# Patient Record
Sex: Female | Born: 1975 | Race: White | Hispanic: No | Marital: Married | Smoking: Never smoker
Health system: Southern US, Community
[De-identification: ages and names within clinical notes are randomized; demographics above are authoritative.]

## PROBLEM LIST (undated history)

## (undated) DIAGNOSIS — D509 Iron deficiency anemia, unspecified: Secondary | ICD-10-CM

## (undated) DIAGNOSIS — E785 Hyperlipidemia, unspecified: Secondary | ICD-10-CM

## (undated) DIAGNOSIS — E119 Type 2 diabetes mellitus without complications: Secondary | ICD-10-CM

## (undated) DIAGNOSIS — R000 Tachycardia, unspecified: Secondary | ICD-10-CM

## (undated) DIAGNOSIS — I10 Essential (primary) hypertension: Secondary | ICD-10-CM

## (undated) HISTORY — DX: Iron deficiency anemia, unspecified: D50.9

## (undated) HISTORY — DX: Tachycardia, unspecified: R00.0

## (undated) HISTORY — DX: Essential (primary) hypertension: I10

## (undated) HISTORY — DX: Type 2 diabetes mellitus without complications: E11.9

## (undated) HISTORY — DX: Hyperlipidemia, unspecified: E78.5

---

## 2018-05-22 NOTE — Progress Notes (Signed)
Cardiology Office Note   Date:  05/25/2018   ID:  BRENNA STEERMAN, DOB 1975-12-17, MRN [id number]  PCP:  Aderoju, Marin Olp, MD  Cardiologist:   Charlton Haws, MD   No chief complaint on file.     History of Present Illness: Kristine Barker is a 43 y.o. female who presents for consultation regarding Tachycardia . Referred by Aderoju,Elizabeth She has a history of tachycardia and diabetes. Thyroid has been normal and not anemic. Started on Toprol in addition to ACE for BP and pulse. Mother had MI and tachycardia. A1c is poorly controlled at 8.0 TTE done 02/23/18 Normal mild LVH no valve disease ? Small PFO no evidence of pulmonary HTN   Holter showed average HR 96 with range 61-156 no significant arrhythmia   She is also worried about her risk of MI Family history positive No chest pain Started on statin a few weeks ago    Past Medical History:  Diagnosis Date  . Diabetes mellitus (HCC)   . Hyperlipidemia   . Hypertension   . Iron deficiency anemia   . Tachycardia      Current Outpatient Medications  Medication Sig Dispense Refill  . aspirin EC 81 MG tablet Take 81 mg by mouth daily.    . Calcium Carbonate-Vitamin D3 600-400 MG-UNIT TABS Take 2 tablets by mouth daily.    Marland Kitchen glipiZIDE (GLUCOTROL XL) 5 MG 24 hr tablet Take 5 mg by mouth daily with breakfast.    . lisinopril (PRINIVIL,ZESTRIL) 10 MG tablet Take 10 mg by mouth daily.    . metFORMIN (GLUCOPHAGE-XR) 500 MG 24 hr tablet Take 2,000 mg by mouth daily with breakfast. Take 4 tablets by mouth daily    . metoprolol succinate (TOPROL-XL) 50 MG 24 hr tablet Take 50 mg by mouth daily.     No current facility-administered medications for this visit.     Allergies:   Patient has no known allergies.    Social History:  The patient  reports that she has never smoked. She has never used smokeless tobacco. She reports that she does not drink alcohol or use drugs.   Family History:  The patient's family history  is not on file.    ROS:  Please see the history of present illness.   Otherwise, review of systems are positive for none.   All other systems are reviewed and negative.    PHYSICAL EXAM: VS:  BP 128/90   Pulse (!) 109   Ht 5\' 3"  (1.6 m)   Wt 170 lb (77.1 kg)   BMI 30.11 kg/m  , BMI Body mass index is 30.11 kg/m. Affect appropriate Healthy:  appears stated age HEENT: normal Neck supple with no adenopathy JVP normal no bruits no thyromegaly Lungs clear with no wheezing and good diaphragmatic motion Heart:  S1/S2 no murmur, no rub, gallop or click PMI normal Abdomen: benighn, BS positve, no tenderness, no AAA no bruit.  No HSM or HJR Distal pulses intact with no bruits No edema Neuro non-focal Skin warm and dry No muscular weakness    EKG:  SR rate 96 normal    Recent Labs: No results found for requested labs within last 8760 hours.    Lipid Panel No results found for: CHOL, TRIG, HDL, CHOLHDL, VLDL, LDLCALC, LDLDIRECT    Wt Readings from Last 3 Encounters:  05/25/18 170 lb (77.1 kg)      Other studies Reviewed: Additional studies/ records that were reviewed today include: notes from Dr  [facility] Card[facility] Notes primary labs ECG , TTE and Holter .    ASSESSMENT AND PLAN:  1.  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  2. CAD Risk recommended calcium score hopefully to be done today 3. HLD:  Started on statin f/u labs primary LDL goal will partially depend on calcium score 4. DM:  Discussed low carb diet.  Target hemoglobin A1c is 6.5 or less.  Continue current medications. 5. HTN:  Well controlled.  Continue current medications and low sodium Dash type diet.      Current medicines are reviewed at length with the patient today.  The patient does not have concerns regarding medicines.  The following changes have been made:   no change  Labs/ tests ordered today include: Calcium score No orders of the defined types were placed in this encounter.    Disposition:   FU with cardiology PRN      Signed, Charlton Haws, MD  05/25/2018 9:42 AM    [facility] [address] [place], [place], [place]  [address] Phone: [phone]; Fax: [phone]

## 2018-05-23 ENCOUNTER — Encounter: Payer: Self-pay | Admitting: Cardiovascular Disease

## 2018-05-25 ENCOUNTER — Encounter: Payer: Self-pay | Admitting: Cardiovascular Disease

## 2018-05-25 ENCOUNTER — Ambulatory Visit (INDEPENDENT_AMBULATORY_CARE_PROVIDER_SITE_OTHER)
Admission: RE | Admit: 2018-05-25 | Discharge: 2018-05-25 | Disposition: A | Discharge status: Home / Self Care | Payer: Managed Care, Other (non HMO) | Source: Ambulatory Visit | Attending: Cardiovascular Disease | Admitting: Cardiovascular Disease

## 2018-05-25 ENCOUNTER — Telehealth: Payer: Self-pay

## 2018-05-25 ENCOUNTER — Ambulatory Visit: Payer: Managed Care, Other (non HMO) | Admitting: Cardiovascular Disease

## 2018-05-25 ENCOUNTER — Encounter (INDEPENDENT_AMBULATORY_CARE_PROVIDER_SITE_OTHER): Payer: Self-pay

## 2018-05-25 VITALS — BP 128/90 | HR 109 | Ht 63.0 in | Wt 170.0 lb

## 2018-05-25 DIAGNOSIS — R000 Tachycardia, unspecified: Secondary | ICD-10-CM

## 2018-05-25 DIAGNOSIS — R931 Abnormal findings on diagnostic imaging of heart and coronary circulation: Secondary | ICD-10-CM

## 2018-05-25 NOTE — Telephone Encounter (Signed)
-----   Message from Wendall Stade, MD sent at 05/25/2018 11:38 AM EST ----- Calcium score high Just started on statin target LDL less than 70  Since score high have her do f/u ETT

## 2018-05-25 NOTE — Patient Instructions (Addendum)
Medication Instructions:   If you need a refill on your cardiac medications before your next appointment, please call your pharmacy.   Lab work:  If you have labs (blood work) drawn today and your tests are completely normal, you will receive your results only by: . MyChart Message (if you have MyChart) OR . A paper copy in the mail If you have any lab test that is abnormal or we need to change your treatment, we will call you to review the results.  Testing/Procedures  Cardiac CT scanning for calcium score, (CAT scanning), is a noninvasive, special x-ray that produces cross-sectional images of the body using x-rays and a computer. CT scans help physicians diagnose and treat medical conditions. For some CT exams, a contrast material is used to enhance visibility in the area of the body being studied. CT scans provide greater clarity and reveal more details than regular x-ray exams.  Follow-Up: At [facility], you and your health needs are our priority.  As part of our continuing mission to provide you with exceptional heart care, we have created designated Provider Care Teams.  These Care Teams include your primary Cardiologist (physician) and Advanced Practice Providers (APPs -  Physician Assistants and Nurse Practitioners) who all work together to provide you with the care you need, when you need it. Your physician recommends that you schedule a follow-up appointment as needed with Dr. Nishan.    

## 2018-05-25 NOTE — Telephone Encounter (Signed)
Called patient with results. Per Dr. Eden EmmsNishan, calcium score high Just started on statin target LDL less than 70. Since score high have her do f/u ETT. Will order ETT. Patient given instructions for ETT and told to hold her metoprolol for test. Patient verbalized understanding.  Will send message to scheduling to call patient with an appointment.

## 2018-05-28 NOTE — Addendum Note (Signed)
Addended by: Oleta Mouse on: 05/28/2018 07:58 AM   Modules accepted: Orders

## 2018-05-28 NOTE — Addendum Note (Signed)
Addended by: Oleta MouseVERTON, Monta Maiorana M on: 05/28/2018 12:48 PM   Modules accepted: Orders

## 2018-06-05 ENCOUNTER — Telehealth: Payer: Self-pay | Admitting: Cardiovascular Disease

## 2018-06-05 NOTE — Telephone Encounter (Signed)
Left message for patient to call back  

## 2018-06-05 NOTE — Telephone Encounter (Signed)
New Message   PT is calling because she is a new pt and was seen and has had a CT and will have her Treadmill test Friday. She is not sure if she is suppose to schedule a follow up appt after these tests  Please call and advise

## 2018-06-08 ENCOUNTER — Ambulatory Visit (INDEPENDENT_AMBULATORY_CARE_PROVIDER_SITE_OTHER): Payer: Managed Care, Other (non HMO)

## 2018-06-08 DIAGNOSIS — R931 Abnormal findings on diagnostic imaging of heart and coronary circulation: Secondary | ICD-10-CM | POA: Diagnosis not present

## 2018-06-08 LAB — EXERCISE TOLERANCE TEST
CSEPPHR: 184 {beats}/min
Estimated workload: 13.4 METS
Exercise duration (min): 10 min
Exercise duration (sec): 30 s
MPHR: 178 {beats}/min
Percent HR: 103 %
RPE: 15
Rest HR: 112 {beats}/min

## 2018-06-11 ENCOUNTER — Telehealth: Payer: Self-pay

## 2018-06-11 NOTE — Telephone Encounter (Signed)
-----   Message from Wendall Stade, MD sent at 06/11/2018  9:23 AM EST ----- Needs lipid and liver profile and started on statin if LDL over 70 can f/u with cardiology in a year

## 2018-06-11 NOTE — Telephone Encounter (Signed)
Called patient with recommendations. Patient stated she is already taking Atorvastatin 20 mg by mouth daily. Patient stated she started this in December and her PCP is checking her Lipid profile in March. Requested patient send our office a copy of lab work once it is done, since patient lives in [place]. Informed patient that she would get a letter in 10 months letting her know it is time to schedule. Patient verbalized understanding. Will update patient's medication list and put in recall.

## 2018-06-13 NOTE — Telephone Encounter (Signed)
Patient is aware that she will be seeing Dr. Eden Emms in one year for f/u

## 2019-02-01 IMAGING — CT CT HEART SCORING
2 series · 14 of 20 positions shown, 16 images · non-contrast
Comparison: None.

Addendum:
CLINICAL DATA: Risk stratification

EXAM:
Coronary Calcium Score
TECHNIQUE: The patient was scanned on a Siemens Somatom 64 slice scanner. Axial
non-contrast 3mm slices were carried out through the heart. The data
set was analyzed on a dedicated work station and scored using the
Agatson method.

[Series 3: casc 3.0 i36f 2 bestdiast 68 % · axial · 0.34mm/px · z∈[+1307,+1382]mm · 6 of 35 slices shown, 8 images]
[im 5/35  vessel]
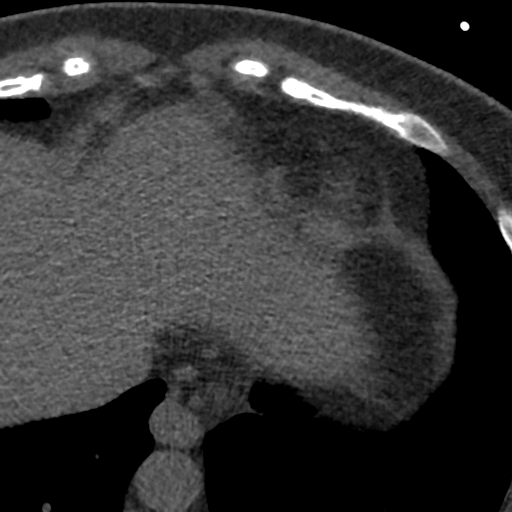
[im 5/35  lung]
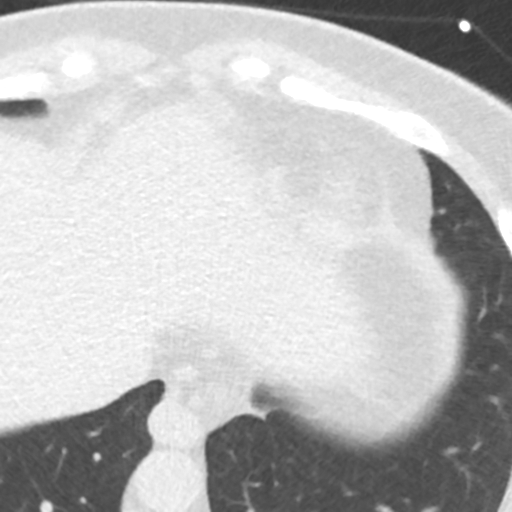
[im 10/35  vessel]
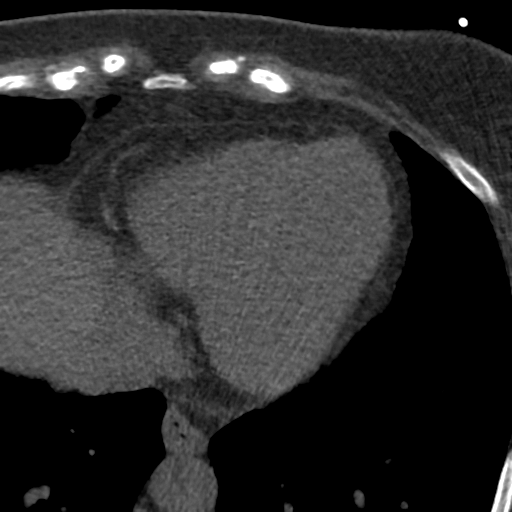
[im 15/35  vessel]
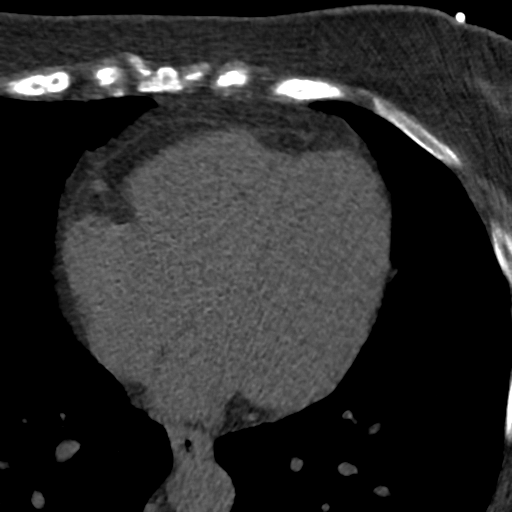
[im 20/35  vessel]
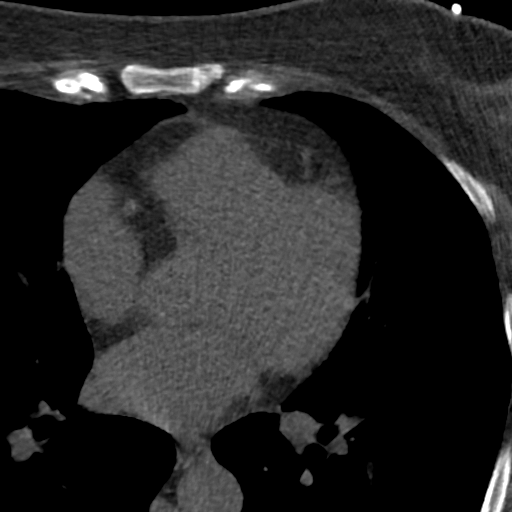
[im 25/35  vessel]
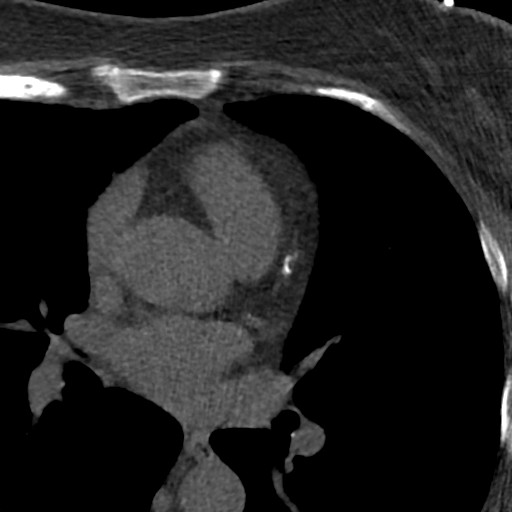
[im 25/35  lung]
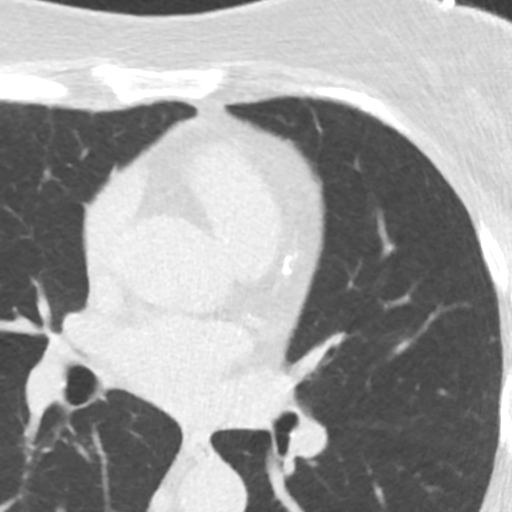
[im 30/35  vessel]
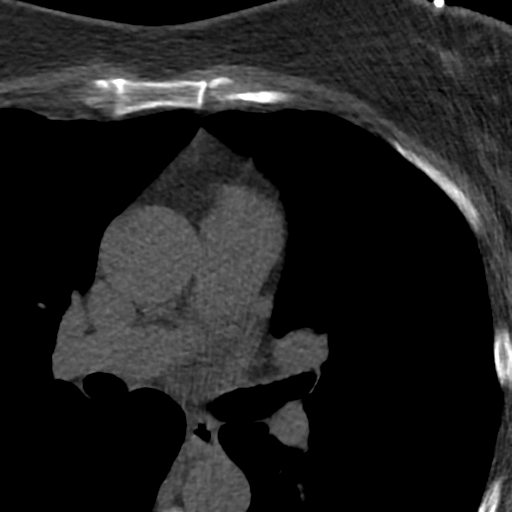

[Series 7: axial 73 % · axial · 0.49mm/px · z∈[+1304,+1386]mm · 8 of 51 slices shown]
[im 5/51  vessel]
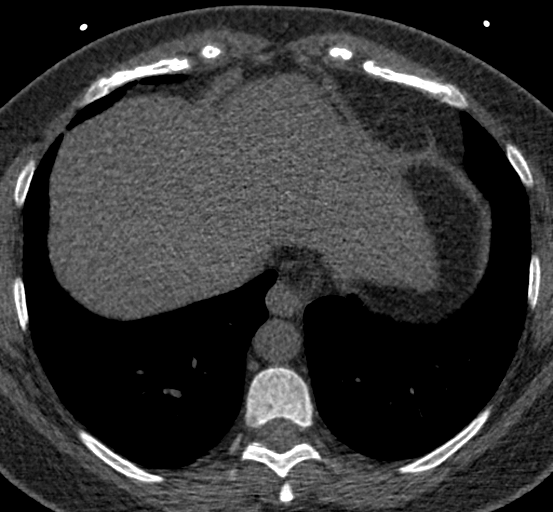
[im 10/51  vessel]
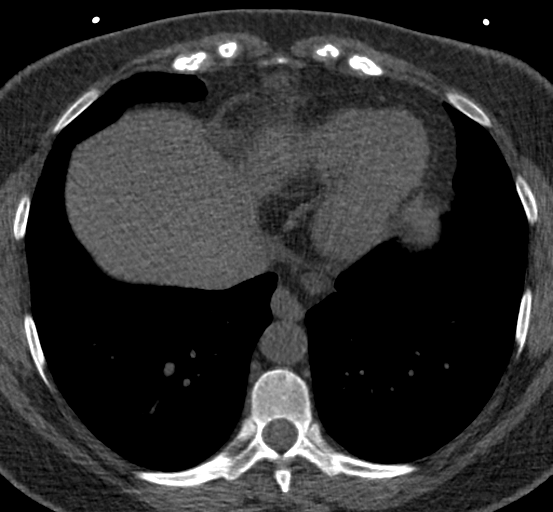
[im 19/51  vessel]
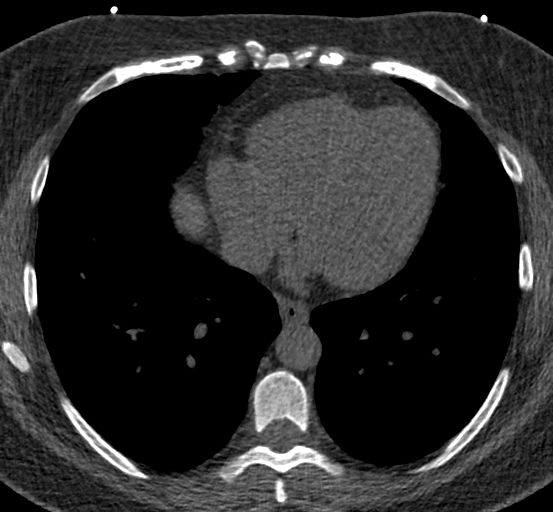
[im 23/51  vessel]
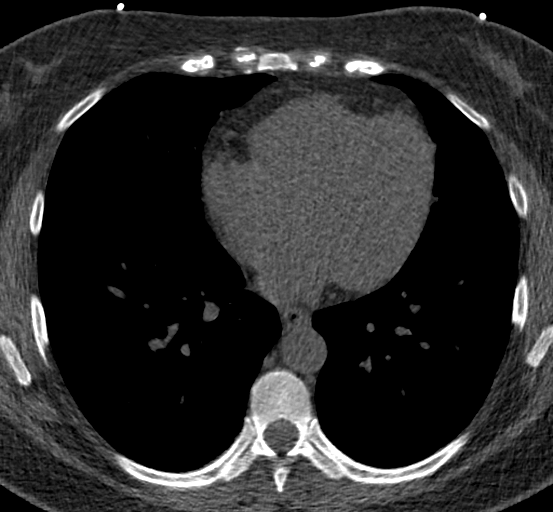
[im 28/51  vessel]
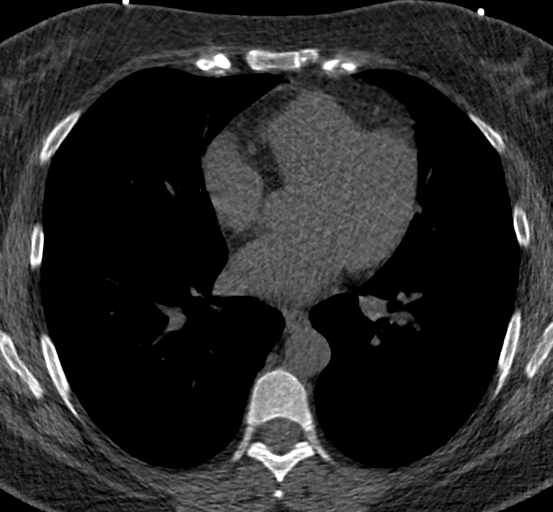
[im 32/51  vessel]
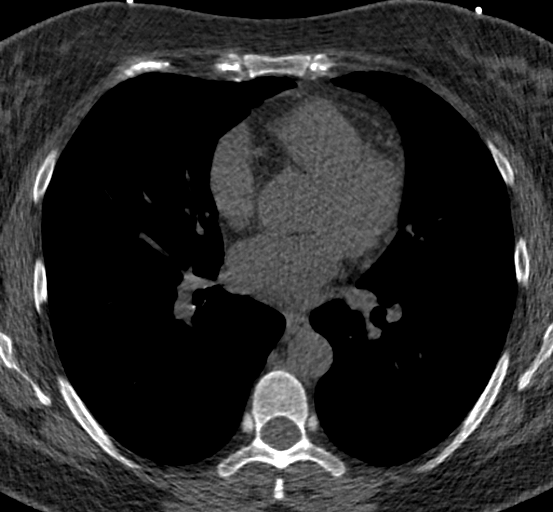
[im 41/51  vessel]
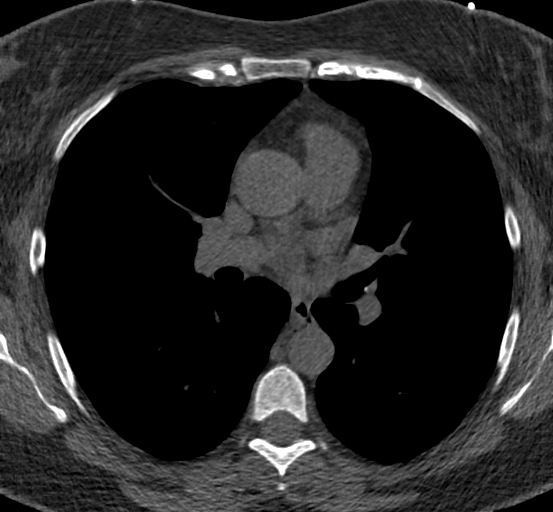
[im 46/51  vessel]
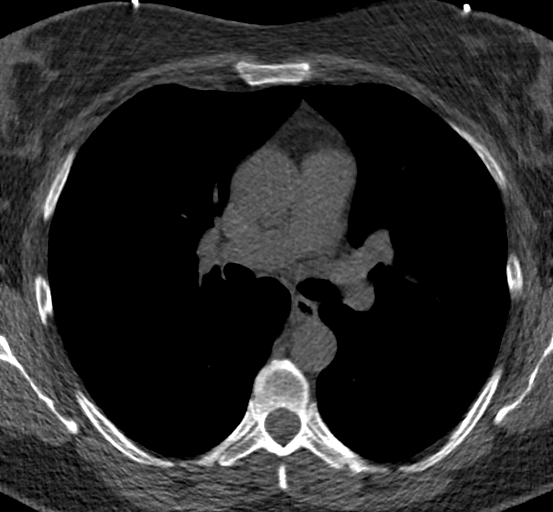

[14 of 20 positions shown; findings below may reference images not displayed]

FINDINGS: Non-cardiac: No significant non cardiac findings on limited lung and
soft tissue windows. See separate report from [REDACTED].

Ascending Aorta: Normal diameter 3.2 cm

Pericardium: Normal

Coronary arteries: Calcium noted in proximal and mid LAD as well as
proximal and distal RCA
IMPRESSION: Coronary calcium score of 74. This was 99 th percentile for age and
sex matched control.

Jaimini Lowery

EXAM:
OVER-READ INTERPRETATION  CT CHEST

The following report is an over-read performed by radiologist Dr.
over-read does not include interpretation of cardiac or coronary
anatomy or pathology. The coronary calcium score interpretation by
the cardiologist is attached.
FINDINGS: The visualized portions of the mediastinum are unremarkable. There
is no adenopathy or mass identified.

Fat density subpleural nodule overlying the posteromedial right
lower lobe is identified compatible with a benign abnormality. No
suspicious nodules or masses identified.

Visualized portions of the upper abdomen are unremarkable.

No acute or suspicious bone abnormality identified.
IMPRESSION: 1. Negative over-read.

*** End of Addendum ***

## 2019-07-15 NOTE — Progress Notes (Deleted)
Cardiology Office Note   Date:  07/15/2019   ID:  Kristine Barker, DOB 1975/07/17, MRN [id number]  PCP:  Aderoju, Jadene Pierini, MD  Cardiologist:   Jenkins Rouge, MD   No chief complaint on file.     History of Present Illness: Kristine Barker is a 44 y.o. female who presents for f/u regarding Tachycardia HLD and CAD . Referred by Aderoju,Elizabeth  On 05/25/18 She has a history of tachycardia and diabetes. Thyroid has been normal and not anemic. Started on Toprol in addition to ACE for BP and pulse. Mother had MI and tachycardia. A1c is poorly controlled at 8.0  TTE done 02/23/18 Normal mild LVH no valve disease ? Small PFO no evidence of pulmonary HTN   Holter showed average HR 96 with range 61-156 no significant arrhythmia   She is also worried about her risk of MI Family history positive No chest pain Started on statin 2020 ***  Calcium Score 05/25/18 74 which is 99 th percentile for age ETT normal on 06/08/18   ***   Past Medical History:  Diagnosis Date  . Diabetes mellitus ([place])   . Hyperlipidemia   . Hypertension   . Iron deficiency anemia   . Tachycardia      Current Outpatient Medications  Medication Sig Dispense Refill  . aspirin EC 81 MG tablet Take 81 mg by mouth daily.    Marland Kitchen atorvastatin (LIPITOR) 20 MG tablet Take 20 mg by mouth daily at 6 PM.    . Calcium Carbonate-Vitamin D3 600-400 MG-UNIT TABS Take 2 tablets by mouth daily.    Marland Kitchen glipiZIDE (GLUCOTROL XL) 5 MG 24 hr tablet Take 5 mg by mouth daily with breakfast.    . lisinopril (PRINIVIL,ZESTRIL) 10 MG tablet Take 10 mg by mouth daily.    . metFORMIN (GLUCOPHAGE-XR) 500 MG 24 hr tablet Take 2,000 mg by mouth daily with breakfast. Take 4 tablets by mouth daily    . metoprolol succinate (TOPROL-XL) 50 MG 24 hr tablet Take 50 mg by mouth daily.     No current facility-administered medications for this visit.    Allergies:   Patient has no known allergies.    Social History:  The patient   reports that she has never smoked. She has never used smokeless tobacco. She reports that she does not drink alcohol or use drugs.   Family History:  The patient's family history is not on file.    ROS:  Please see the history of present illness.   Otherwise, review of systems are positive for none.   All other systems are reviewed and negative.    PHYSICAL EXAM: VS:  There were no vitals taken for this visit. , BMI There is no height or weight on file to calculate BMI. Affect appropriate Healthy:  appears stated age 89: normal Neck supple with no adenopathy JVP normal no bruits no thyromegaly Lungs clear with no wheezing and good diaphragmatic motion Heart:  S1/S2 no murmur, no rub, gallop or click PMI normal Abdomen: benighn, BS positve, no tenderness, no AAA no bruit.  No HSM or HJR Distal pulses intact with no bruits No edema Neuro non-focal Skin warm and dry No muscular weakness    EKG:  SR rate 96 normal    Recent Labs: No results found for requested labs within last 8760 hours.    Lipid Panel No results found for: CHOL, TRIG, HDL, CHOLHDL, VLDL, LDLCALC, LDLDIRECT    Wt Readings from Last 3  Encounters:  05/25/18 170 lb (77.1 kg)      Other studies Reviewed: Additional studies/ records that were reviewed today include: notes from Dr Ramond Dial Cardiology Notes primary labs ECG , TTE and Holter .    ASSESSMENT AND PLAN:  1.  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  2. CAD Risk  Calcium score 74 99 th percentile for age 53/31/20 Normal ETT 06/08/18 on ASA and statin  3. HLD:  On statin *** 4. DM:  Discussed low carb diet.  Target hemoglobin A1c is 6.5 or less.  Continue current medications. 5. HTN:  Well controlled.  Continue current medications and low sodium Dash type diet.      Current medicines are reviewed at  length with the patient today.  The patient does not have concerns regarding medicines.  The following changes have been made:  no change  Labs/ tests ordered today include: *** No orders of the defined types were placed in this encounter.    Disposition:   FU with cardiology in a year     Signed, Charlton Haws, MD  07/15/2019 10:06 AM    [facility] [address] [place], [place], [place]  [address] Phone: [phone]; Fax: [phone]

## 2019-07-19 ENCOUNTER — Ambulatory Visit: Payer: Managed Care, Other (non HMO) | Admitting: Cardiovascular Disease

## 2019-09-24 NOTE — Progress Notes (Signed)
Cardiology Office Note   Date:  09/27/2019   ID:  Kristine Barker, DOB Dec 09, 1975, MRN [id number]  PCP:  Aderoju, Marin Olp, MD  Cardiologist:   Charlton Haws, MD   No chief complaint on file.     History of Present Illness: Kristine Barker is a 44 y.o. female first seen 05/25/18  regarding Tachycardia . Referred by Aderoju,Elizabeth She has a history of tachycardia and diabetes. Thyroid has been normal and not anemic. Started on Toprol in addition to ACE for BP and pulse. Mother had MI and tachycardia. A1c is poorly controlled at 8.0  TTE done 02/23/18 Normal mild LVH no valve disease ? Small PFO no evidence of pulmonary HTN   Holter showed average HR 96 with range 61-156 no significant arrhythmia   She is also worried about her risk of MI Family history positive No chest pain 2020 primary following labs  Normal ETT 06/08/18 Calcium score 74 05/25/18 which was 99 th percentile for age involving the proximal / mid LAD and proximal / distal RCA Started on statin   Her BS has been poorly controlled despite weight loss. 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 Diabetes mellitus (HCC)   . Hyperlipidemia   . Hypertension   . Iron deficiency anemia   . Tachycardia      Current Outpatient Medications  Medication Sig Dispense Refill  . aspirin EC 81 MG tablet Take 81 mg by mouth daily.    Marland Kitchen atorvastatin (LIPITOR) 20 MG tablet Take 20 mg by mouth daily at 6 PM.    . Calcium Carbonate-Vitamin D3 600-400 MG-UNIT TABS Take 2 tablets by mouth daily.    . Insulin Glargine (BASAGLAR KWIKPEN) 100 UNIT/ML Inject 24 Units into the skin at bedtime.    Marland Kitchen lisinopril (PRINIVIL,ZESTRIL) 10 MG tablet Take 10 mg by mouth daily.    .  metFORMIN (GLUCOPHAGE-XR) 500 MG 24 hr tablet Take 2,000 mg by mouth daily with breakfast. Take 4 tablets by mouth daily    . metoprolol succinate (TOPROL-XL) 50 MG 24 hr tablet Take 50 mg by mouth daily.    . TRULICITY 0.75 MG/0.5ML SOPN Inject 0.75 mLs into the skin once a week.     No current facility-administered medications for this visit.    Allergies:   Patient has no known allergies.    Social History:  The patient  reports that she has never smoked. She has never used smokeless tobacco. She reports that she does not drink alcohol or use drugs.   Family History:  The patient's family history is not on file.    ROS:  Please see the history of present illness.   Otherwise, review of systems are positive for none.   All other systems are reviewed and negative.    PHYSICAL EXAM: VS:  BP 116/74   Pulse 96   Ht 5\' 3"  (1.6 m)   Wt 160 lb (72.6 kg)   BMI 28.34 kg/m  , BMI Body mass index is 28.34 kg/m. Affect appropriate Healthy:  appears stated age HEENT: normal Neck supple with no adenopathy JVP normal no bruits no thyromegaly Lungs clear with no wheezing and good diaphragmatic motion Heart:  S1/S2 no murmur, no  rub, gallop or click PMI normal Abdomen: benighn, BS positve, no tenderness, no AAA no bruit.  No HSM or HJR Distal pulses intact with no bruits No edema Neuro non-focal Skin warm and dry No muscular weakness    EKG: 09/27/19  SR rate 96 normal    Recent Labs: No results found for requested labs within last 8760 hours.    Lipid Panel No results found for: CHOL, TRIG, HDL, CHOLHDL, VLDL, LDLCALC, LDLDIRECT    Wt Readings from Last 3 Encounters:  09/27/19 160 lb (72.6 kg)  05/25/18 170 lb (77.1 kg)      Other studies Reviewed: Additional studies/ records that were reviewed today include: notes from Dr Brock Bad Cardiology Notes primary labs ECG , TTE and Holter .    ASSESSMENT AND PLAN:  1.  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 CAD Risk high calcium score for age on statin ETT normal  3. HLD:  Started on statin f/u labs primary LDL goal 70 or less 4. DM:  Discussed low carb diet.  Target hemoglobin A1c is 6.5 or less.  Continue current medications. 5. HTN:  Well controlled.  Continue current medications and low sodium Dash type diet.    F/U in a year consider ETT every 3 years or with new symptoms  Current medicines are reviewed at length with the patient today.  The patient does not have concerns regarding medicines.  The following changes have been made:  no change  Labs/ tests ordered today include: None  No orders of the defined types were placed in this encounter.    Disposition:   FU with cardiology in a year      Signed, Jenkins Rouge, MD  09/27/2019 8:26 AM    [address], [place], [place]  [address] Phone: [phone]; Fax: [phone]

## 2019-09-27 ENCOUNTER — Other Ambulatory Visit: Payer: Self-pay

## 2019-09-27 ENCOUNTER — Ambulatory Visit: Payer: Managed Care, Other (non HMO) | Admitting: Cardiovascular Disease

## 2019-09-27 VITALS — BP 116/74 | HR 96 | Ht 63.0 in | Wt 160.0 lb

## 2019-09-27 DIAGNOSIS — R000 Tachycardia, unspecified: Secondary | ICD-10-CM

## 2019-09-27 NOTE — Patient Instructions (Addendum)

## 2019-09-27 NOTE — Addendum Note (Signed)
Addended by: Oleta Mouse on: 09/27/2019 09:12 AM   Modules accepted: Orders

## 2020-09-08 ENCOUNTER — Telehealth: Payer: Self-pay | Admitting: Cardiovascular Disease

## 2020-09-08 NOTE — Telephone Encounter (Signed)
New Message:      Pt wants to know if she needs another CT Calcium Scoring and Echo before her next iffice viisit?

## 2020-09-08 NOTE — Telephone Encounter (Signed)
The patient had calcium score Jan 2020. GXT Feb 2020  She saw Dr. Eden Emms June 2021 and "F/U in a year consider ETT every 3 years or with new symptoms" was recommended.   The patient reports she feels great and has no symptoms at this time. She has lost a lot of weight and her A1C has "drastically reduced."  Since Dr. Eden Emms only has virtual appointments available at this time, she is happy to bypass this year's visit and have testing done prior to her visit next year.   Will request Dr. Fabio Bering nurse to call next year to arrange stress test and office visit.   Will send to Dr. Eden Emms to see if he'd like any other testing prior to visit.

## 2021-09-16 NOTE — Progress Notes (Incomplete)
Cardiology Office Note   Date:  09/16/2021   ID:  Kristine Barker, DOB 1975/07/18, MRN [id number]  PCP:  Aderoju, Marin Olp, MD  Cardiologist:   Charlton Haws, MD   No chief complaint on file.     History of Present Illness: Kristine Barker is a 46 y.o. female first seen 05/25/18  regarding Tachycardia . Referred by Aderoju,Elizabeth She has a history of tachycardia and diabetes. Thyroid has been normal and not anemic. Started on Toprol in addition to ACE for BP and pulse. Mother had MI and tachycardia. A1c is poorly controlled at 8.0  TTE done 02/23/18 Normal mild LVH no valve disease ? 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     atorvastatin (LIPITOR) 20 MG tablet Take 20 mg by mouth daily at 6 PM.     Calcium Carbonate-Vitamin D3 600-400 MG-UNIT TABS Take 2 tablets by mouth daily.     Insulin Glargine (BASAGLAR KWIKPEN) 100 UNIT/ML Inject 24 Units into the skin at bedtime.     lisinopril (PRINIVIL,ZESTRIL) 10 MG tablet Take 10 mg by mouth daily.     metFORMIN (GLUCOPHAGE-XR) 500 MG 24 hr tablet Take 2,000 mg by mouth daily with breakfast. Take 4 tablets by mouth daily     metoprolol succinate (TOPROL-XL) 50 MG 24 hr tablet Take 50 mg  by mouth daily.     TRULICITY 0.75 MG/0.5ML SOPN Inject 0.75 mLs into the skin once a week.     No current facility-administered medications for this visit.    Allergies:   Patient has no known allergies.    Social History:  The patient  reports that she has never smoked. She has never used smokeless tobacco. She reports that she does not drink alcohol and does not use drugs.   Family History:  The patient's family history is not on file.    ROS:  Please see the history of present illness.   Otherwise, review of systems are positive for none.   All other systems are reviewed and negative.    PHYSICAL EXAM: VS:  There were no vitals taken for this visit. , BMI There is no height or weight on file to calculate BMI. Affect appropriate Healthy:  appears stated age HEENT: normal Neck supple with no adenopathy JVP normal no bruits no thyromegaly Lungs clear with no wheezing and good diaphragmatic motion Heart:  S1/S2 no murmur, no rub, gallop or click PMI normal Abdomen: benighn, BS positve, no tenderness, no AAA no bruit.  No HSM or HJR Distal pulses intact with no bruits No edema Neuro non-focal Skin warm and dry No muscular weakness  EKG: 09/27/19  SR rate 96 normal    Recent Labs: No results found for requested labs within last 8760 hours.    Lipid Panel No results found for: CHOL, TRIG, HDL, CHOLHDL, VLDL, LDLCALC, LDLDIRECT    Wt Readings from Last 3 Encounters:  09/27/19 160 lb (72.6 kg)  05/25/18 170 lb (77.1 kg)      Other studies Reviewed: Additional studies/ records that were reviewed today include: notes from Dr Ramond Dial Cardiology Notes primary labs ECG , TTE and Holter .    ASSESSMENT AND PLAN:  1.  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 CAD Risk high calcium score for age 31/31/20  on statin ETT  normal 06/08/18 will update calcium score *** 3. HLD:  on statin LDL 48 08/13/21 at goal  4. DM:  Discussed low carb diet.  Target hemoglobin A1c is 6.5 or less.  Continue current medications. Most recent A1c in Epic 6.0 07/24/20  5. HTN:  Well controlled.  Continue current medications and low sodium Dash type diet.    F/U in a year   Update Calcium Score ***  Current medicines are reviewed at length with the patient today.  The patient does not have concerns regarding medicines.  The following changes have been made:  no change  Labs/ tests ordered today include: None  No orders of the defined types were placed in this encounter.    Disposition:   FU with cardiology in a year      Signed, Charlton Haws, MD  09/16/2021 4:00 PM    [facility] [address] [place], [place], [place]  [address] Phone: [phone]; Fax: [phone]

## 2021-09-22 NOTE — Progress Notes (Unsigned)
Cardiology Office Note   Date:  09/24/2021   ID:  RANETTA ARMACOST, DOB August 18, 1975, MRN [id number]  PCP:  Aderoju, Marin Olp, MD  Cardiologist:   Charlton Haws, MD   No chief complaint on file.     History of Present Illness: Kristine Barker is a 46 y.o. female first seen 05/25/18  regarding Tachycardia . Referred by Aderoju,Elizabeth She has a history of tachycardia and diabetes. Thyroid has been normal and not anemic. Started on Toprol in addition to ACE for BP and pulse. Mother had MI and tachycardia. A1c is poorly controlled at 8.0  TTE done 02/23/18 Normal mild LVH no valve disease ? 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     atorvastatin (LIPITOR) 20 MG tablet Take 20 mg by mouth daily at 6 PM.     Calcium Carbonate-Vitamin D3 600-400 MG-UNIT TABS Take 2 tablets by mouth daily.     lisinopril (PRINIVIL,ZESTRIL) 10 MG tablet Take 10 mg by mouth daily.     metFORMIN (GLUCOPHAGE-XR) 500 MG 24 hr tablet Take 2,000 mg by mouth daily with breakfast. Take 4 tablets by mouth daily     tirzepatide (MOUNJARO) 7.5 MG/0.5ML Pen Inject into the skin. Inject 0.5 mLs (7.5 mg total) subcutaneously every 7 (seven) days      Insulin Glargine (BASAGLAR KWIKPEN) 100 UNIT/ML Inject 24 Units into the skin at bedtime. (Patient not taking: Reported on 09/24/2021)     metoprolol succinate (TOPROL-XL) 50 MG 24 hr tablet Take 50 mg by mouth daily.     TRULICITY 0.75 MG/0.5ML SOPN Inject 0.75 mLs into the skin once a week. (Patient not taking: Reported on 09/24/2021)     No current facility-administered medications for this visit.    Allergies:   Patient has no known allergies.    Social History:  The patient  reports that she has never smoked. She has never used smokeless tobacco. She reports that she does not drink alcohol and does not use drugs.   Family History:  The patient's family history is not on file.    ROS:  Please see the history of present illness.   Otherwise, review of systems are positive for none.   All other systems are reviewed and negative.    PHYSICAL EXAM: VS:  BP 110/60   Pulse 95   Ht 5\' 3"  (1.6 m)   Wt 121 lb (54.9 kg)   SpO2 98%   BMI 21.43 kg/m  , BMI Body mass index is 21.43 kg/m. Affect appropriate Healthy:  appears stated age HEENT: normal Neck supple with no adenopathy JVP  normal no bruits no thyromegaly Lungs clear with no wheezing and good diaphragmatic motion Heart:  S1/S2 no murmur, no rub, gallop or click PMI normal Abdomen: benighn, BS positve, no tenderness, no AAA no bruit.  No HSM or HJR Distal pulses intact with no bruits No edema Neuro non-focal Skin warm and dry No muscular weakness    EKG: 09/27/19  SR rate 96 normal 09/24/2021 NSR rate 95 normal    Recent Labs: No results found for requested labs within last 8760 hours.    Lipid Panel No results found for: CHOL, TRIG, HDL, CHOLHDL, VLDL, LDLCALC, LDLDIRECT    Wt Readings from Last 3 Encounters:  09/24/21 121 lb (54.9 kg)  09/27/19 160 lb (72.6 kg)  05/25/18 170 lb (77.1 kg)      Other studies Reviewed: Additional studies/ records that were reviewed today include: notes from Dr  Ramond Dial Cardiology Notes primary labs ECG , TTE and Holter .    ASSESSMENT AND PLAN:  1.  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 CAD Risk high calcium score for age 38/31/20  on statin ETT normal 06/08/18 will update calcium score  3. HLD:  on statin LDL 48 08/13/21 at goal  4. DM:  Discussed low carb diet.  Target hemoglobin A1c is 6.5 or less.  Continue current medications. Most recent A1c in Epic 6.0 07/24/20  5. HTN:  Well controlled.  Continue current medications and low sodium Dash type diet.    F/U in a year   Update Calcium Score   Current medicines are reviewed at length with the patient today.  The patient does not have concerns regarding medicines.  The following changes have been made:  no change  Labs/ tests ordered today include: None  No orders of the defined types were placed in this encounter.    Disposition:   FU with cardiology in a year      Signed, Charlton Haws, MD  09/24/2021 9:46 AM    [facility] [address] [place], [place], [place]  [address] Phone: [phone]; Fax: [phone]

## 2021-09-23 ENCOUNTER — Ambulatory Visit: Payer: Managed Care, Other (non HMO) | Admitting: Cardiovascular Disease

## 2021-09-24 ENCOUNTER — Ambulatory Visit: Payer: Managed Care, Other (non HMO) | Admitting: Cardiovascular Disease

## 2021-09-24 ENCOUNTER — Encounter: Payer: Self-pay | Admitting: Cardiovascular Disease

## 2021-09-24 VITALS — BP 110/60 | HR 95 | Ht 63.0 in | Wt 121.0 lb

## 2021-09-24 DIAGNOSIS — E785 Hyperlipidemia, unspecified: Secondary | ICD-10-CM | POA: Diagnosis not present

## 2021-09-24 DIAGNOSIS — R000 Tachycardia, unspecified: Secondary | ICD-10-CM

## 2021-09-24 NOTE — Patient Instructions (Signed)
Medication Instructions:  Your physician recommends that you continue on your current medications as directed. Please refer to the Current Medication list given to you today.  *If you need a refill on your cardiac medications before your next appointment, please call your pharmacy*  Lab Work: If you have labs (blood work) drawn today and your tests are completely normal, you will receive your results only by: MyChart Message (if you have MyChart) OR A paper copy in the mail If you have any lab test that is abnormal or we need to change your treatment, we will call you to review the results.  Testing/Procedures: Cardiac CT scanning for calcium score, (CAT scanning), is a noninvasive, special x-ray that produces cross-sectional images of the body using x-rays and a computer. CT scans help physicians diagnose and treat medical conditions. For some CT exams, a contrast material is used to enhance visibility in the area of the body being studied. CT scans provide greater clarity and reveal more details than regular x-ray exams.  Follow-Up: At [facility], you and your health needs are our priority.  As part of our continuing mission to provide you with exceptional heart care, we have created designated Provider Care Teams.  These Care Teams include your primary Cardiologist (physician) and Advanced Practice Providers (APPs -  Physician Assistants and Nurse Practitioners) who all work together to provide you with the care you need, when you need it.  We recommend signing up for the patient portal called "MyChart".  Sign up information is provided on this After Visit Summary.  MyChart is used to connect with patients for Virtual Visits (Telemedicine).  Patients are able to view lab/test results, encounter notes, upcoming appointments, etc.  Non-urgent messages can be sent to your provider as well.   To learn more about what you can do with MyChart, go to [email or web address].    Your next  appointment:   1 year(s)  The format for your next appointment:   In Person  Provider:   Peter Nishan, MD {  Important Information About Sugar       

## 2021-10-01 ENCOUNTER — Ambulatory Visit
Admission: RE | Admit: 2021-10-01 | Discharge: 2021-10-01 | Disposition: A | Discharge status: Home / Self Care | Payer: Managed Care, Other (non HMO) | Source: Ambulatory Visit | Attending: Cardiovascular Disease | Admitting: Cardiovascular Disease

## 2021-10-01 ENCOUNTER — Telehealth: Payer: Self-pay

## 2021-10-01 DIAGNOSIS — E785 Hyperlipidemia, unspecified: Secondary | ICD-10-CM

## 2021-10-01 DIAGNOSIS — R5383 Other fatigue: Secondary | ICD-10-CM

## 2021-10-01 DIAGNOSIS — R931 Abnormal findings on diagnostic imaging of heart and coronary circulation: Secondary | ICD-10-CM

## 2021-10-01 DIAGNOSIS — R000 Tachycardia, unspecified: Secondary | ICD-10-CM | POA: Insufficient documentation

## 2021-10-01 DIAGNOSIS — R0789 Other chest pain: Secondary | ICD-10-CM

## 2021-10-01 DIAGNOSIS — R002 Palpitations: Secondary | ICD-10-CM

## 2021-10-01 NOTE — Telephone Encounter (Signed)
-----   Message from Wendall Stade, MD sent at 10/01/2021 11:08 AM EDT ----- Calcium score much higher than 2020 needs f/u PET/CT scan

## 2021-10-01 NOTE — Telephone Encounter (Signed)
Calling back to get more information on the PET/CT Stress test needing to be done. She wants to know if she should be scheduling this or will someone else

## 2021-10-01 NOTE — Telephone Encounter (Signed)
The patient has been notified of the result and verbalized understanding.  All questions (if any) were answered. Ethelda Chick, RN 10/01/2021 11:30 AM    Place order for test and will send instructions through mychart.

## 2021-10-01 NOTE — Telephone Encounter (Signed)
Called patient back about message. Informed patient that someone will be calling her to schedule test from [facility].

## 2021-10-11 ENCOUNTER — Encounter: Payer: Self-pay | Admitting: Cardiovascular Disease

## 2021-10-20 NOTE — Telephone Encounter (Signed)
Called patient about her message. Patient complaining of feeling extremely tired, chest discomfort, and racing heart at times. Patient stated there is no way she could do a treadmill stress test. Will update patient's symptoms to order of the Cardiac PET/CT. Will forward to Dr. Eden Emms for further recommendations.

## 2022-02-15 ENCOUNTER — Telehealth: Payer: Self-pay

## 2022-02-15 DIAGNOSIS — E785 Hyperlipidemia, unspecified: Secondary | ICD-10-CM

## 2022-02-15 DIAGNOSIS — R931 Abnormal findings on diagnostic imaging of heart and coronary circulation: Secondary | ICD-10-CM

## 2022-02-15 DIAGNOSIS — R5383 Other fatigue: Secondary | ICD-10-CM

## 2022-02-15 DIAGNOSIS — R0789 Other chest pain: Secondary | ICD-10-CM

## 2022-02-15 DIAGNOSIS — R002 Palpitations: Secondary | ICD-10-CM

## 2022-02-15 NOTE — Telephone Encounter (Signed)
Josue Hector, MD  Michaelyn Barter, RN Can order exercise myovue    ----- Message -----  From: Michaelyn Barter, RN  Sent: 02/15/2022   1:21 PM EDT  To: Josue Hector, MD  Subject: FW: [facility] Pet Ct                                   Wound you like to order something else?  ----- Message -----  From: [name]  Sent: 02/15/2022   9:55 AM EDT  To: Michaelyn Barter, RN  Subject: [facility] Pet Ct                                       Spoke with [facility] - patient test was denied  per Insurance .  Order will be close    Ordered Kristine Barker for patient. Patient aware of change. Will send instructions through mychart.

## 2022-05-11 ENCOUNTER — Encounter: Payer: Self-pay | Admitting: Cardiovascular Disease

## 2022-05-18 ENCOUNTER — Telehealth (HOSPITAL_COMMUNITY): Payer: Self-pay | Admitting: *Deleted

## 2022-05-18 NOTE — Telephone Encounter (Signed)
Patient given detailed instructions per Myocardial Perfusion Study Information Sheet for the test on 05/20/2022 at 7:45. Patient notified to arrive 15 minutes early and that it is imperative to arrive on time for appointment to keep from having the test rescheduled.  If you need to cancel or reschedule your appointment, please call the office within 24 hours of your appointment. . Patient verbalized understanding.Kristine Barker

## 2022-05-20 ENCOUNTER — Ambulatory Visit (HOSPITAL_COMMUNITY): Payer: Managed Care, Other (non HMO) | Attending: Internal Medicine

## 2022-05-20 DIAGNOSIS — R5383 Other fatigue: Secondary | ICD-10-CM | POA: Insufficient documentation

## 2022-05-20 DIAGNOSIS — R931 Abnormal findings on diagnostic imaging of heart and coronary circulation: Secondary | ICD-10-CM | POA: Insufficient documentation

## 2022-05-20 DIAGNOSIS — R002 Palpitations: Secondary | ICD-10-CM | POA: Insufficient documentation

## 2022-05-20 DIAGNOSIS — E785 Hyperlipidemia, unspecified: Secondary | ICD-10-CM | POA: Diagnosis present

## 2022-05-20 DIAGNOSIS — R0789 Other chest pain: Secondary | ICD-10-CM | POA: Insufficient documentation

## 2022-05-20 LAB — MYOCARDIAL PERFUSION IMAGING
Angina Index: 0
Duke Treadmill Score: 10
Estimated workload: 11.7
Exercise duration (min): 10 min
Exercise duration (sec): 1 s
LV dias vol: 41 mL (ref 46–106)
LV sys vol: 12 mL
MPHR: 174 {beats}/min
Nuc Stress EF: 72 %
Peak HR: 184 {beats}/min
Percent HR: 105 %
Rest HR: 104 {beats}/min
Rest Nuclear Isotope Dose: 10.9 mCi
SDS: 1
SRS: 1
SSS: 2
ST Depression (mm): 0 mm
Stress Nuclear Isotope Dose: 32.1 mCi
TID: 0.86

## 2022-05-20 MED ORDER — TECHNETIUM TC 99M TETROFOSMIN IV KIT
32.1000 | PACK | Freq: Once | INTRAVENOUS | Status: AC | PRN
  Administered 2022-05-20: 32.1 via INTRAVENOUS

## 2022-05-20 MED ORDER — TECHNETIUM TC 99M TETROFOSMIN IV KIT
10.9000 | PACK | Freq: Once | INTRAVENOUS | Status: AC | PRN
  Administered 2022-05-20: 10.9 via INTRAVENOUS

## 2022-06-10 IMAGING — CT CT CARDIAC CORONARY ARTERY CALCIUM SCORE
4 series · 12 of 20 positions shown, 13 images · non-contrast
Comparison: CT coronary calcium score 05/25/2018

Addendum:
CLINICAL DATA: Risk stratification

EXAM:
Coronary Calcium Score
TECHNIQUE: The patient was scanned on a Siemens Somatom 64 slice scanner. Axial
non-contrast 3mm slices were carried out through the heart. The data
set was analyzed on a dedicated work station and scored using the
Agatson method.

[Series 2: ax ca scr 70% (id) · axial · 0.27mm/px · z∈[+1382,+1460]mm · 4 of 66 slices shown (1 of 2)]
[im 14/66  vessel]
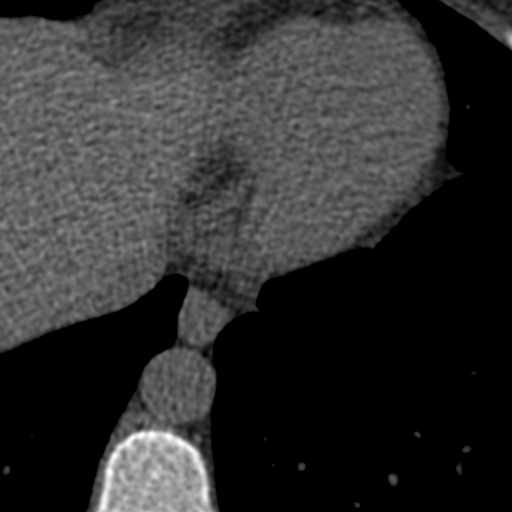
[im 27/66  vessel]
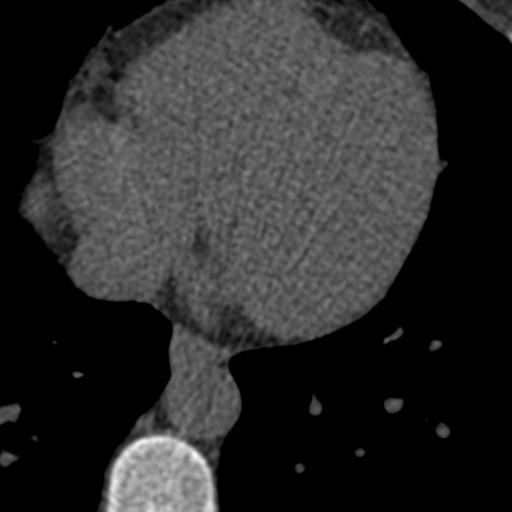
[im 40/66  vessel]
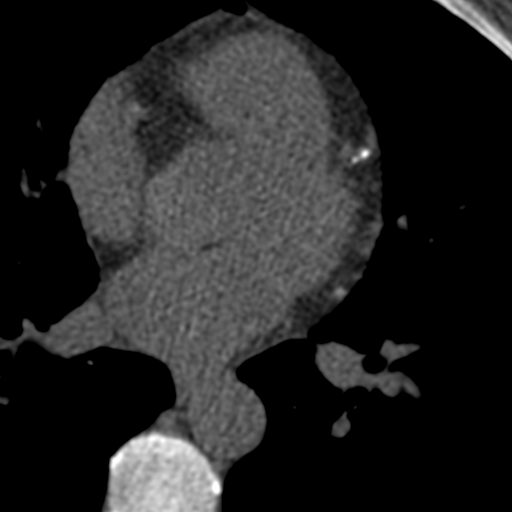
[im 53/66  vessel]
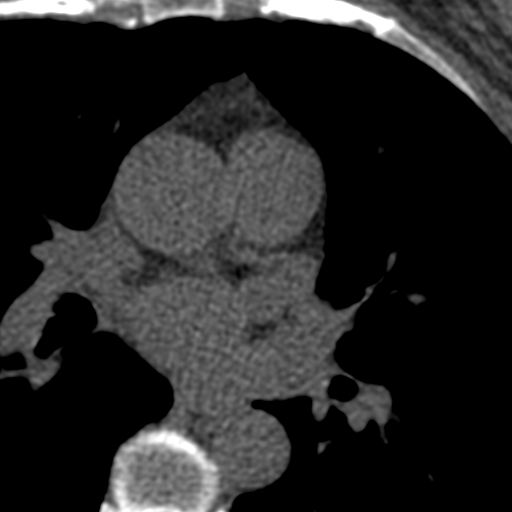

[Series 3: ax st (open fov) · axial · 0.55mm/px · z∈[+1399,+1441]mm · 2 of 44 slices shown, 3 images]
[im 15/44  vessel]
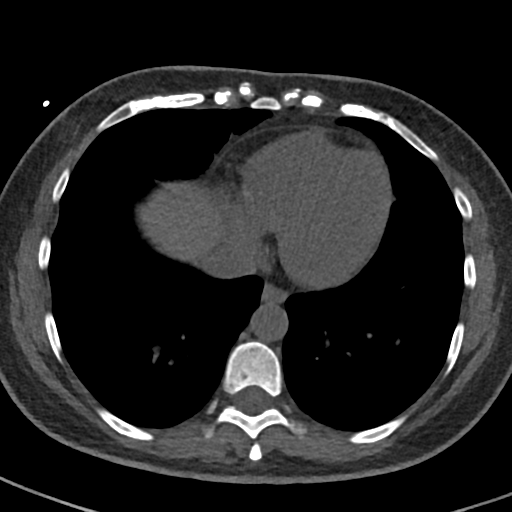
[im 15/44  lung]
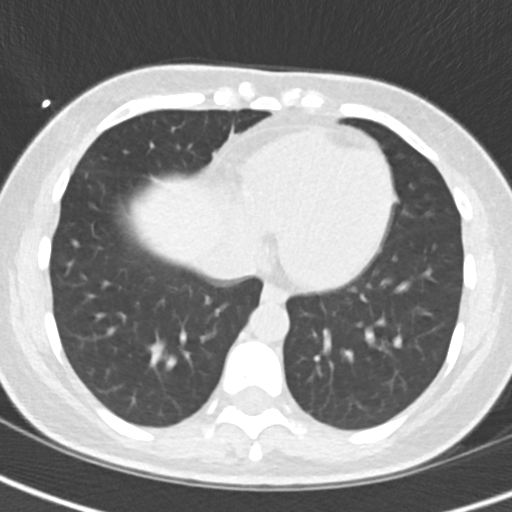
[im 29/44  vessel]
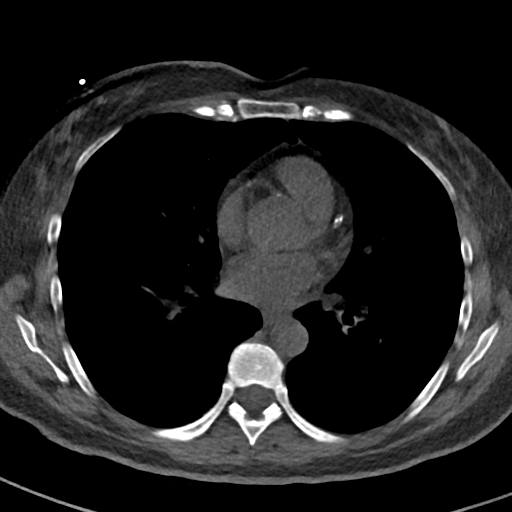

[Series 4: ax lung (open fov) · axial · 0.55mm/px · z∈[+1399,+1441]mm · 2 of 44 slices shown]
[im 15/44  lung]
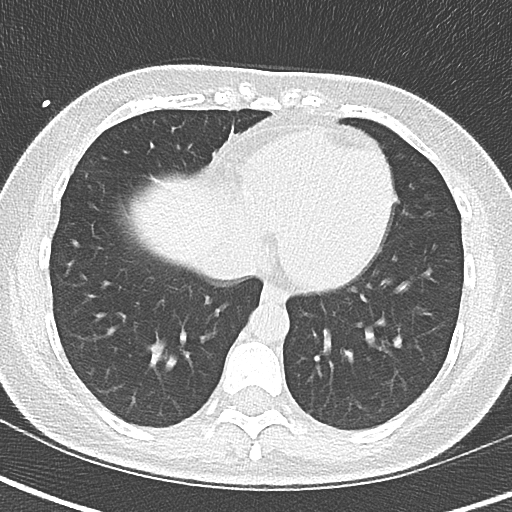
[im 29/44  lung]
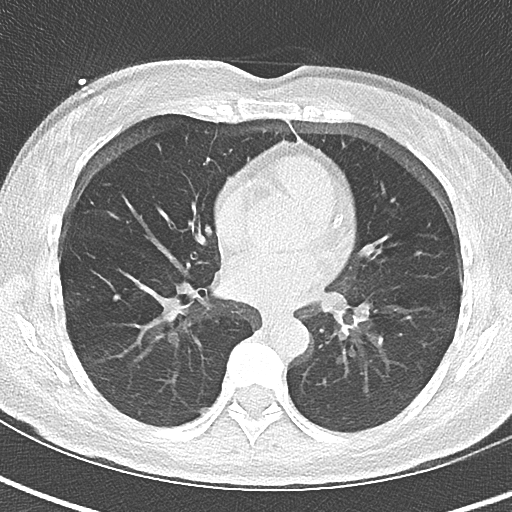

[Series 6: ax ca scr 70% (id) · axial · 0.27mm/px · z∈[+1382,+1460]mm · 4 of 66 slices shown (2 of 2)]
[im 14/66  vessel]
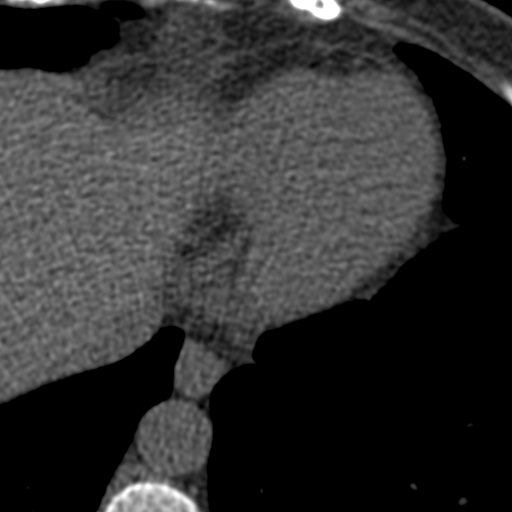
[im 27/66  vessel]
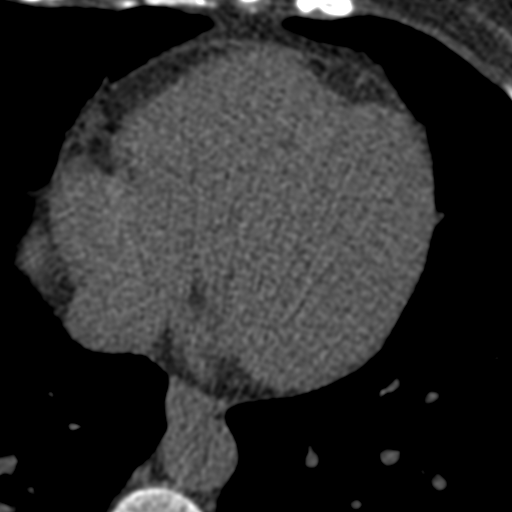
[im 40/66  vessel]
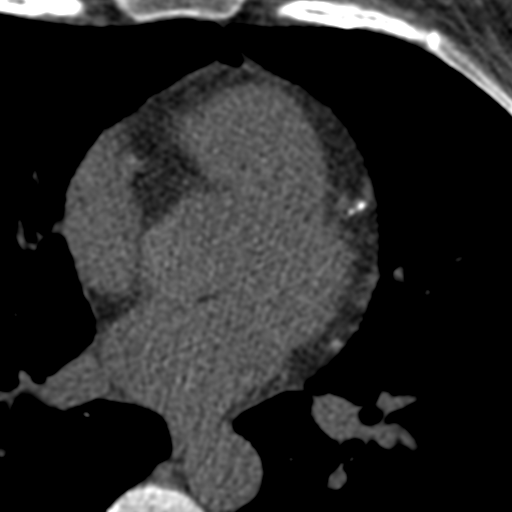
[im 53/66  vessel]
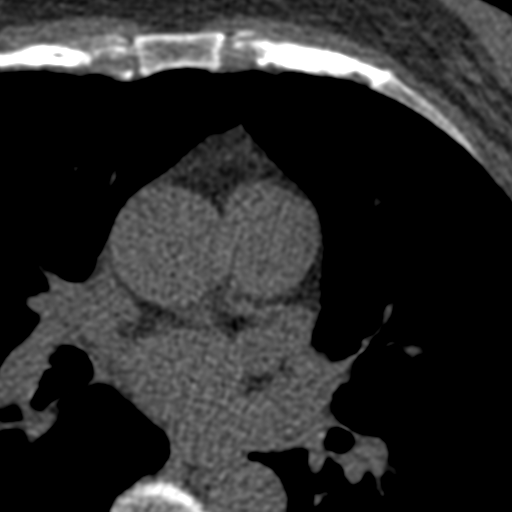

[12 of 20 positions shown; findings below may reference images not displayed]

FINDINGS: Non-cardiac: No significant non cardiac findings on limited lung and
soft tissue windows. See separate report from [REDACTED].

Ascending Aorta: Normal diameter 3.3 cm

Pericardium: Normal

Coronary arteries: Age advanced 3 vessel calcium noted
IMPRESSION: Coronary calcium score of 441. This was 99 th percentile for age and
sex matched control.

Consider f/u perfusion study

Gradjevinski Radovi Bomestar

EXAM:
OVER-READ INTERPRETATION  CT CHEST

The following report is an over-read performed by radiologist Dr.
Nasha Takahashi [REDACTED] on 10/01/2021. This over-read
does not include interpretation of cardiac or coronary anatomy or
pathology. The coronary calcium score interpretation by the
cardiologist is attached.
FINDINGS: Cardiovascular: There are no significant extracardiac vascular
findings.

Mediastinum/Nodes: There are no enlarged lymph nodes within the
visualized mediastinum.

Lungs/Pleura: There is no pleural effusion. Unchanged benign
subpleural nodule within the posteromedial right lower lobe (axial
series 4, image 11) punctate benign calcified granuloma within the
posterior right lower lobe (axial series 4, image 23).

Upper abdomen: No significant findings in the visualized upper
abdomen.

Musculoskeletal/Chest wall: No chest wall mass or suspicious osseous
findings within the visualized chest.
IMPRESSION: No significant extracardiac findings within the visualized chest.

*** End of Addendum ***
FINDINGS: Non-cardiac: No significant non cardiac findings on limited lung and
soft tissue windows. See separate report from [REDACTED].

Ascending Aorta: Normal diameter 3.3 cm

Pericardium: Normal

Coronary arteries: Age advanced 3 vessel calcium noted
IMPRESSION: Coronary calcium score of 441. This was 99 th percentile for age and
sex matched control.

Consider f/u perfusion study

Gradjevinski Radovi Bomestar

## 2022-08-23 NOTE — Progress Notes (Incomplete)
Cardiology Office Note   Date:  08/23/2022   ID:  Kristine Barker, DOB 1975-09-07, MRN [id number]  PCP:  Aderoju, Marin Olp, MD  Cardiologist:   Charlton Haws, MD   No chief complaint on file.     History of Present Illness: Kristine Barker is a 47 y.o. female first seen 05/25/18  regarding Tachycardia . Referred by Aderoju,Elizabeth She has a history of tachycardia and diabetes. Thyroid has been normal and not anemic. Started on Toprol in addition to ACE for BP and pulse. Mother had MI and tachycardia. A1c is poorly controlled at 8.0  TTE done 02/23/18 Normal mild LVH no valve disease ? 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 Diabetes mellitus (HCC)   . Hyperlipidemia   . Hypertension   . Iron deficiency anemia   . Tachycardia      Current Outpatient Medications  Medication Sig Dispense Refill  . aspirin EC 81 MG tablet Take 81 mg by mouth daily.    Marland Kitchen atorvastatin (LIPITOR) 20 MG tablet Take 20 mg by mouth daily at 6 PM.    . Calcium Carbonate-Vitamin D3 600-400 MG-UNIT TABS Take 2 tablets by mouth daily.    . Insulin Glargine (BASAGLAR KWIKPEN) 100 UNIT/ML Inject 24 Units into the skin at bedtime. (Patient not taking: Reported on 09/24/2021)    . lisinopril (PRINIVIL,ZESTRIL) 10 MG tablet Take 10 mg by mouth daily.    . metFORMIN (GLUCOPHAGE-XR) 500 MG 24 hr tablet Take 2,000 mg by mouth daily with breakfast.  Take 4 tablets by mouth daily    . metoprolol succinate (TOPROL-XL) 50 MG 24 hr tablet Take 50 mg by mouth daily.    . tirzepatide (MOUNJARO) 7.5 MG/0.5ML Pen Inject into the skin. Inject 0.5 mLs (7.5 mg total) subcutaneously every 7 (seven) days    . TRULICITY 0.75 MG/0.5ML SOPN Inject 0.75 mLs into the skin once a week. (Patient not taking: Reported on 09/24/2021)     No current facility-administered medications for this visit.    Allergies:   Patient has no known allergies.    Social History:  The patient  reports that she has never smoked. She has never used smokeless tobacco. She reports that she does not drink alcohol and does not use drugs.   Family History:  The patient's family history is not on file.    ROS:  Please see the history of present illness.   Otherwise, review of systems are positive for none.   All other systems are reviewed and negative.    PHYSICAL EXAM: VS:  There were no vitals taken for this visit. , BMI There is no height or weight on file to calculate BMI. Affect appropriate Healthy:  appears stated age HEENT: normal Neck supple with no adenopathy JVP normal no bruits no thyromegaly Lungs clear with no wheezing and  good diaphragmatic motion Heart:  S1/S2 no murmur, no rub, gallop or click PMI normal Abdomen: benighn, BS positve, no tenderness, no AAA no bruit.  No HSM or HJR Distal pulses intact with no bruits No edema Neuro non-focal Skin warm and dry No muscular weakness    EKG: 09/27/19  SR rate 96 normal 08/23/2022 NSR rate 95 normal    Recent Labs: No results found for requested labs within last 365 days.    Lipid Panel No results found for: "CHOL", "TRIG", "HDL", "CHOLHDL", "VLDL", "LDLCALC", "LDLDIRECT"    Wt Readings from Last 3 Encounters:  05/20/22 121 lb (54.9 kg)  09/24/21 121 lb (54.9 kg)  09/27/19 160 lb (72.6 kg)      Other studies Reviewed: Additional studies/ records that were reviewed today include: notes from Dr  Ramond Dial Cardiology Notes primary labs ECG , TTE and Holter .    ASSESSMENT AND PLAN:  1.  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 CAD Risk high calcium score for age 63, 31 th percentile Normal myovue 05/20/22  3. HLD:  on statin LDL 48 08/13/21 at goal  4. DM:  Discussed low carb diet.  Target hemoglobin A1c is 6.5 or less.  Continue current medications. Most recent A1c in Epic 5.4 on 03/11/22  5. HTN:  Well controlled.  Continue current medications and low sodium Dash type diet.    F/U in a year      Current medicines are reviewed at length with the patient today.  The patient does not have concerns regarding medicines.  The following changes have been made:  no change  Labs/ tests ordered today include: None  No orders of the defined types were placed in this encounter.    Disposition:   FU with cardiology in a year      Signed, Charlton Haws, MD  08/23/2022 11:00 AM    [facility] [address] [place], [place], [place]  [address] Phone: [phone]; Fax: [phone]

## 2022-09-02 ENCOUNTER — Ambulatory Visit: Payer: Managed Care, Other (non HMO) | Admitting: Cardiovascular Disease

## 2023-01-24 NOTE — Progress Notes (Deleted)
Cardiology Office Note   Date:  01/24/2023   ID:  AOI KOUNS, DOB 1976-02-03, MRN [id number]  PCP:  Aderoju, Marin Olp, MD  Cardiologist:   Charlton Haws, MD     History of Present Illness: Kristine Barker is a 47 y.o. female first seen 05/25/18  regarding Tachycardia . Referred by Aderoju,Elizabeth She has a history of tachycardia and diabetes. Thyroid has been normal and not anemic. Started on Toprol in addition to ACE for BP and pulse. Mother had MI and tachycardia. A1c is poorly controlled at 8.0  TTE done 02/23/18 Normal mild LVH no valve disease ? 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     atorvastatin (LIPITOR) 20 MG tablet Take 20 mg by mouth daily at 6 PM.     Calcium Carbonate-Vitamin D3 600-400 MG-UNIT TABS Take 2 tablets by mouth daily.     Insulin Glargine (BASAGLAR KWIKPEN) 100 UNIT/ML Inject 24 Units into the skin at bedtime. (Patient not taking: Reported on 09/24/2021)     lisinopril (PRINIVIL,ZESTRIL) 10 MG tablet Take 10 mg by mouth daily.     metFORMIN (GLUCOPHAGE-XR) 500 MG 24 hr tablet Take  2,000 mg by mouth daily with breakfast. Take 4 tablets by mouth daily     metoprolol succinate (TOPROL-XL) 50 MG 24 hr tablet Take 50 mg by mouth daily.     tirzepatide [facility]) 7.5 MG/0.5ML Pen Inject into the skin. Inject 0.5 mLs (7.5 mg total) subcutaneously every 7 (seven) days     TRULICITY 0.75 MG/0.5ML SOPN Inject 0.75 mLs into the skin once a week. (Patient not taking: Reported on 09/24/2021)     No current facility-administered medications for this visit.    Allergies:   Patient has no known allergies.    Social History:  The patient  reports that she has never smoked. She has never used smokeless tobacco. She reports that she does not drink alcohol and does not use drugs.   Family History:  The patient's family history is not on file.    ROS:  Please see the history of present illness.   Otherwise, review of systems are positive for none.   All other systems are reviewed and negative.    PHYSICAL EXAM: VS:  There were no vitals taken for this visit. , BMI There is no height or weight on file to calculate BMI. Affect appropriate Healthy:  appears stated age HEENT: normal Neck supple with no adenopathy JVP normal no bruits no thyromegaly Lungs clear  with no wheezing and good diaphragmatic motion Heart:  S1/S2 no murmur, no rub, gallop or click PMI normal Abdomen: benighn, BS positve, no tenderness, no AAA no bruit.  No HSM or HJR Distal pulses intact with no bruits No edema Neuro non-focal Skin warm and dry No muscular weakness    EKG: 09/27/19  SR rate 96 normal 01/24/2023 NSR rate 95 normal    Recent Labs: No results found for requested labs within last 365 days.    Lipid Panel No results found for: "CHOL", "TRIG", "HDL", "CHOLHDL", "VLDL", "LDLCALC", "LDLDIRECT"    Wt Readings from Last 3 Encounters:  05/20/22 121 lb (54.9 kg)  09/24/21 121 lb (54.9 kg)  09/27/19 160 lb (72.6 kg)      Other studies Reviewed: Additional studies/ records that were  reviewed today include: notes from Dr Ramond Dial Cardiology Notes primary labs ECG , TTE and Holter .    ASSESSMENT AND PLAN:  1.  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 CAD Risk high calcium score for age 47/31/20  on statin ETT normal 06/08/18 normal myovue 05/20/22  3. HLD:  on statin LDL64 at goal 12/03/21  4. DM:  Discussed low carb diet.  Target hemoglobin A1c is 6.5 or less.  Continue current medications. Most recent A1c in Epic 5.9 09/27/22  5. HTN:  Well controlled.  Continue current medications and low sodium Dash type diet.    F/U in a year   Update Calcium Score   Current medicines are reviewed at length with the patient today.  The patient does not have concerns regarding medicines.  The following changes have been made:  no change  Labs/ tests ordered today include: None  No orders of the defined types were placed in this encounter.    Disposition:   FU with cardiology in a year      Signed, Charlton Haws, MD  01/24/2023 5:13 PM    [facility] [address] [place], [place], [place]  [address] Phone: [phone]; Fax: [phone]

## 2023-02-03 ENCOUNTER — Ambulatory Visit: Payer: Managed Care, Other (non HMO) | Admitting: Cardiovascular Disease

## 2023-06-26 NOTE — Progress Notes (Deleted)
 Cardiology Office Note   Date:  06/26/2023   ID:  Kristine Barker, DOB 12/02/1975, MRN [id number]  PCP:  Aderoju, Marin Olp, MD  Cardiologist:   Charlton Haws, MD     History of Present Illness: Kristine Barker is a 48 y.o. female first seen 05/25/18  regarding Tachycardia . Referred by Aderoju,Elizabeth She has a history of tachycardia and diabetes. Thyroid has been normal and not anemic. Started on Toprol in addition to ACE for BP and pulse. Mother had MI and tachycardia. A1c is poorly controlled at 8.0  TTE done 02/23/18 Normal mild LVH no valve disease ? 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     atorvastatin (LIPITOR) 20 MG tablet Take 20 mg by mouth daily at 6 PM.     Calcium Carbonate-Vitamin D3 600-400 MG-UNIT TABS Take 2 tablets by mouth daily.     Insulin Glargine (BASAGLAR KWIKPEN) 100 UNIT/ML Inject 24 Units into the skin at bedtime. (Patient not taking: Reported on 09/24/2021)     lisinopril (PRINIVIL,ZESTRIL) 10 MG tablet Take 10 mg by mouth daily.     metFORMIN (GLUCOPHAGE-XR) 500 MG 24 hr tablet Take 2,000 mg by mouth daily with breakfast.  Take 4 tablets by mouth daily     metoprolol succinate (TOPROL-XL) 50 MG 24 hr tablet Take 50 mg by mouth daily.     tirzepatide [facility]) 7.5 MG/0.5ML Pen Inject into the skin. Inject 0.5 mLs (7.5 mg total) subcutaneously every 7 (seven) days     TRULICITY 0.75 MG/0.5ML SOPN Inject 0.75 mLs into the skin once a week. (Patient not taking: Reported on 09/24/2021)     No current facility-administered medications for this visit.    Allergies:   Patient has no known allergies.    Social History:  The patient  reports that she has never smoked. She has never used smokeless tobacco. She reports that she does not drink alcohol and does not use drugs.   Family History:  The patient's family history is not on file.    ROS:  Please see the history of present illness.   Otherwise, review of systems are positive for none.   All other systems are reviewed and negative.    PHYSICAL EXAM: VS:  There were no vitals taken for this visit. , BMI There is no height or weight on file to calculate BMI. Affect appropriate Healthy:  appears stated age HEENT: normal Neck supple with no adenopathy JVP normal no bruits no thyromegaly Lungs clear with no wheezing and good diaphragmatic  motion Heart:  S1/S2 no murmur, no rub, gallop or click PMI normal Abdomen: benighn, BS positve, no tenderness, no AAA no bruit.  No HSM or HJR Distal pulses intact with no bruits No edema Neuro non-focal Skin warm and dry No muscular weakness    EKG: 09/27/19  SR rate 96 normal 06/26/2023 NSR rate 95 normal    Recent Labs: No results found for requested labs within last 365 days.    Lipid Panel No results found for: "CHOL", "TRIG", "HDL", "CHOLHDL", "VLDL", "LDLCALC", "LDLDIRECT"    Wt Readings from Last 3 Encounters:  05/20/22 121 lb (54.9 kg)  09/24/21 121 lb (54.9 kg)  09/27/19 160 lb (72.6 kg)      Other studies Reviewed:  Additional studies/ records that were reviewed today include: notes from Dr  Ramond Dial Cardiology Notes primary labs ECG , TTE and Holter .  Myovue 05/20/22  Study Highlights      The study is normal. The study is low risk.   No ST deviation was noted.   Left ventricular function is normal. Nuclear stress EF: 72 %. The left ventricular ejection fraction is hyperdynamic (>65%). End diastolic cavity size is normal.   Prior study not available for comparison.    ASSESSMENT AND PLAN:  1.  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 CAD Risk high calcium score for age 15 , 38 th percentile 10/01/21   on statin ETT normal 06/08/18 Normal myovue 05/20/22  3. HLD:  on statin LDL 48 08/13/21 at goal  4. DM:  Discussed low carb diet.  Target hemoglobin A1c is 6.5 or less.  Continue current medications. Most recent A1c in Epic 5.9  5. HTN:  Well controlled.  Continue current medications and low sodium Dash type diet.    ***   F/U in a year    Signed, Charlton Haws, MD  06/26/2023 3:37 PM    [facility] [address] [place], [place], [place]  [address] Phone: [phone]; Fax: [phone]

## 2023-07-07 ENCOUNTER — Ambulatory Visit: Payer: Managed Care, Other (non HMO) | Attending: Cardiology | Admitting: Cardiovascular Disease

## 2023-07-10 ENCOUNTER — Encounter: Payer: Self-pay | Admitting: Cardiovascular Disease

## 2023-12-21 NOTE — Progress Notes (Signed)
 Cardiology Office Note   Date:  12/29/2023   ID:  Kristine Barker, DOB [birth year], MRN [id number]  PCP:  Aderoju, Almarie Hau, MD  Cardiologist:   Maude Emmer, MD       History of Present Illness: Kristine Barker is a 48 y.o. female first seen 05/25/18  regarding Tachycardia .  Last seen in 2023 Referred by Kristine Barker has a history of tachycardia and diabetes. Thyroid has been normal and not anemic. Started on Toprol in addition to ACE for BP and pulse. Mother had MI and tachycardia. A1c is poorly controlled at 8.0  TTE done 02/23/18 Normal mild LVH no valve disease ? 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     atorvastatin (LIPITOR) 20 MG tablet Take 20 mg by mouth daily at 6 PM.     Calcium Carbonate-Vitamin D3 600-400 MG-UNIT TABS Take 2 tablets by mouth daily.     lisinopril (PRINIVIL,ZESTRIL) 10 MG tablet Take 10 mg by mouth daily.     metFORMIN (GLUCOPHAGE-XR) 500 MG 24 hr tablet Take 2,000 mg by mouth daily with breakfast. Take 4 tablets by mouth daily     metoprolol succinate  (TOPROL-XL) 50 MG 24 hr tablet Take 50 mg by mouth daily.     Insulin Glargine (BASAGLAR KWIKPEN) 100 UNIT/ML Inject 24 Units into the skin at bedtime. (Patient not taking: Reported on 09/24/2021)     tirzepatide (MOUNJARO) 7.5 MG/0.5ML Pen Inject into the skin. Inject 0.5 mLs (7.5 mg total) subcutaneously every 7 (seven) days     TRULICITY 0.75 MG/0.5ML SOPN Inject 0.75 mLs into the skin once a week. (Patient not taking: Reported on 09/24/2021)     No current facility-administered medications for this visit.    Allergies:   Patient has no known allergies.    Social History:  The patient  reports that Barker has never smoked. Barker has never used smokeless tobacco. Barker reports that Barker does not drink alcohol and does not use drugs.   Family History:  The patient's family history is not on file.    ROS:  Please see the history of present illness.   Otherwise, review of systems are positive for none.   All other systems are reviewed and negative.    PHYSICAL EXAM: VS:  BP 102/71 (BP Location: Left Arm, Patient Position: Sitting)   Pulse 86   Ht 5' 3 (1.6 m)   Wt 120 lb 3.2 oz (54.5 kg)  SpO2 98%   BMI 21.29 kg/m  , BMI Body mass index is 21.29 kg/m. Affect appropriate Healthy:  appears stated age HEENT: normal Neck supple with no adenopathy JVP normal no bruits no thyromegaly Lungs clear with no wheezing and good diaphragmatic motion Heart:  S1/S2 no murmur, no rub, gallop or click PMI normal Abdomen: benighn, BS positve, no tenderness, no AAA no bruit.  No HSM or HJR Distal pulses intact with no bruits No edema Neuro non-focal Skin warm and dry No muscular weakness    EKG: 09/27/19  SR rate 96 normal 12/29/2023 NSR rate 95 normal    Recent Labs: No results found for requested labs within last 365 days.    Lipid Panel No results found for: CHOL, TRIG, HDL, CHOLHDL, VLDL, LDLCALC, LDLDIRECT    Wt Readings from Last 3 Encounters:  12/29/23 120 lb 3.2 oz (54.5  kg)  05/20/22 121 lb (54.9 kg)  09/24/21 121 lb (54.9 kg)      Other studies Reviewed: Additional studies/ records that were reviewed today include: notes from Dr Bronwen Cardiology Notes primary labs ECG , TTE and Holter .    ASSESSMENT AND PLAN:  1.  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 CAD Risk high calcium score for age  99 05/20/22 non ischemic EF normal continue ASA/statin and beta blocker  3. HLD:  on statin LDL 53 at goal  4. DM:  Discussed low carb diet.  A1c 5.6 improved  5. HTN:  Well controlled.  Continue current medications and low sodium Dash type diet.     F/U in a year   Current medicines are reviewed at length with the patient today.  The patient does not have concerns regarding medicines.  The following changes have been made:  no change  Labs/ tests ordered today include: None   Orders Placed This Encounter  Procedures   EKG 12-Lead     Disposition:   FU with cardiology in a year      Signed, Maude Emmer, MD  12/29/2023 10:30 AM    [facility] [address] [place], [place], [place]  [address] Phone: [phone]; Fax: [phone]

## 2023-12-29 ENCOUNTER — Encounter: Payer: Self-pay | Admitting: Cardiovascular Disease

## 2023-12-29 ENCOUNTER — Ambulatory Visit: Attending: Cardiovascular Disease | Admitting: Cardiovascular Disease

## 2023-12-29 VITALS — BP 102/71 | HR 86 | Ht 63.0 in | Wt 120.2 lb

## 2023-12-29 DIAGNOSIS — E785 Hyperlipidemia, unspecified: Secondary | ICD-10-CM | POA: Diagnosis not present

## 2023-12-29 DIAGNOSIS — R002 Palpitations: Secondary | ICD-10-CM

## 2023-12-29 DIAGNOSIS — R931 Abnormal findings on diagnostic imaging of heart and coronary circulation: Secondary | ICD-10-CM | POA: Diagnosis not present

## 2023-12-29 NOTE — Patient Instructions (Signed)
